# Patient Record
Sex: Male | Born: 1985 | Race: Black or African American | Hispanic: No | Marital: Single | Smoking: Current every day smoker
Health system: Southern US, Community
[De-identification: ages and names within clinical notes are randomized; demographics above are authoritative.]

## PROBLEM LIST (undated history)

## (undated) DIAGNOSIS — J45909 Unspecified asthma, uncomplicated: Secondary | ICD-10-CM

---

## 2004-07-16 ENCOUNTER — Emergency Department (HOSPITAL_COMMUNITY): Admission: EM | Admit: 2004-07-16 | Discharge: 2004-07-17 | Payer: Self-pay | Admitting: Emergency Medicine

## 2004-07-17 ENCOUNTER — Emergency Department (HOSPITAL_COMMUNITY): Admission: EM | Admit: 2004-07-17 | Discharge: 2004-07-17 | Payer: Self-pay | Admitting: *Deleted

## 2005-01-05 ENCOUNTER — Emergency Department (HOSPITAL_COMMUNITY): Admission: EM | Admit: 2005-01-05 | Discharge: 2005-01-06 | Payer: Self-pay | Admitting: Emergency Medicine

## 2005-03-13 ENCOUNTER — Emergency Department (HOSPITAL_COMMUNITY): Admission: EM | Admit: 2005-03-13 | Discharge: 2005-03-13 | Payer: Self-pay | Admitting: Emergency Medicine

## 2011-01-21 ENCOUNTER — Emergency Department (HOSPITAL_COMMUNITY)
Admission: EM | Admit: 2011-01-21 | Discharge: 2011-01-21 | Disposition: A | Discharge status: Home / Self Care | Payer: Self-pay | Attending: Emergency Medicine | Admitting: Emergency Medicine

## 2011-01-21 DIAGNOSIS — R5381 Other malaise: Secondary | ICD-10-CM | POA: Insufficient documentation

## 2011-01-21 DIAGNOSIS — R0602 Shortness of breath: Secondary | ICD-10-CM | POA: Insufficient documentation

## 2011-01-21 DIAGNOSIS — H9209 Otalgia, unspecified ear: Secondary | ICD-10-CM | POA: Insufficient documentation

## 2011-01-21 DIAGNOSIS — R5383 Other fatigue: Secondary | ICD-10-CM | POA: Insufficient documentation

## 2011-01-21 DIAGNOSIS — R21 Rash and other nonspecific skin eruption: Secondary | ICD-10-CM | POA: Insufficient documentation

## 2011-01-21 DIAGNOSIS — J45909 Unspecified asthma, uncomplicated: Secondary | ICD-10-CM | POA: Insufficient documentation

## 2013-11-12 ENCOUNTER — Emergency Department (HOSPITAL_COMMUNITY)
Admission: EM | Admit: 2013-11-12 | Discharge: 2013-11-13 | Disposition: A | Discharge status: Home / Self Care | Payer: Self-pay | Attending: Emergency Medicine | Admitting: Emergency Medicine

## 2013-11-12 ENCOUNTER — Encounter (HOSPITAL_COMMUNITY): Payer: Self-pay | Admitting: Emergency Medicine

## 2013-11-12 DIAGNOSIS — K0889 Other specified disorders of teeth and supporting structures: Secondary | ICD-10-CM

## 2013-11-12 DIAGNOSIS — Z792 Long term (current) use of antibiotics: Secondary | ICD-10-CM | POA: Insufficient documentation

## 2013-11-12 DIAGNOSIS — K2970 Gastritis, unspecified, without bleeding: Secondary | ICD-10-CM | POA: Insufficient documentation

## 2013-11-12 DIAGNOSIS — Z880 Allergy status to penicillin: Secondary | ICD-10-CM | POA: Insufficient documentation

## 2013-11-12 DIAGNOSIS — Z79899 Other long term (current) drug therapy: Secondary | ICD-10-CM | POA: Insufficient documentation

## 2013-11-12 DIAGNOSIS — K2990 Gastroduodenitis, unspecified, without bleeding: Secondary | ICD-10-CM

## 2013-11-12 DIAGNOSIS — F17200 Nicotine dependence, unspecified, uncomplicated: Secondary | ICD-10-CM | POA: Insufficient documentation

## 2013-11-12 DIAGNOSIS — K029 Dental caries, unspecified: Secondary | ICD-10-CM | POA: Insufficient documentation

## 2013-11-12 DIAGNOSIS — K089 Disorder of teeth and supporting structures, unspecified: Secondary | ICD-10-CM | POA: Insufficient documentation

## 2013-11-12 MED ORDER — HYDROCODONE-ACETAMINOPHEN 5-325 MG PO TABS: 1.0000 | ORAL_TABLET | Freq: Four times a day (QID) | ORAL | Status: DC | PRN

## 2013-11-12 MED ORDER — CLINDAMYCIN HCL 300 MG PO CAPS
300.0000 mg | ORAL_CAPSULE | Freq: Once | ORAL | Status: AC
  Administered 2013-11-13: 300 mg via ORAL
  Filled 2013-11-12: qty 1

## 2013-11-12 MED ORDER — OMEPRAZOLE 20 MG PO CPDR: 20.0000 mg | DELAYED_RELEASE_CAPSULE | Freq: Every day | ORAL | Status: DC

## 2013-11-12 MED ORDER — SUCRALFATE 1 GM/10ML PO SUSP: 1.0000 g | Freq: Three times a day (TID) | ORAL | Status: AC

## 2013-11-12 MED ORDER — CLINDAMYCIN HCL 150 MG PO CAPS: 300.0000 mg | ORAL_CAPSULE | Freq: Three times a day (TID) | ORAL | Status: DC

## 2013-11-12 MED ORDER — GI COCKTAIL ~~LOC~~
30.0000 mL | Freq: Once | ORAL | Status: AC
  Administered 2013-11-12: 30 mL via ORAL
  Filled 2013-11-12 (×2): qty 30

## 2013-11-12 NOTE — ED Notes (Signed)
The pt has had a toothache a headache with abd pain  And chest since he started taking advil for his toothache.  He has had a ll these symptoms for one week

## 2013-11-12 NOTE — ED Notes (Signed)
Pt states that he started having a tooth ache 3 days ago, states he took 8 tables of ibuprofen over 2 days, and started having abdominal pain today.

## 2013-11-12 NOTE — ED Provider Notes (Signed)
CSN: [id number]     Arrival date & time 11/12/13  2232 History   First MD Initiated Contact with Patient 11/12/13 2309     Chief Complaint  Patient presents with  . multiple complaints     (Consider location/radiation/quality/duration/timing/severity/associated sxs/prior Treatment) HPI Comments: Patient is a 28 year old male with no significant past medical history who presents to the emergency department for dentalgia x5 days. Patient states that pain has been throbbing and constant as well as worsening since onset. Pain located to right upper first molar. Patient has been taking NSAIDs for symptoms with mild, temporary relief. He states that 3 days after NSAID use he developed discomfort in the epigastric region of his abdomen. He states that this pain is sharp and has improved since discontinuing NSAID use. Patient denies associated fever, inability to swallow, drooling, difficulty opening his jaw, facial swelling, neck stiffness, nausea, vomiting, diarrhea, melena or hematochezia, numbness, and weakness.  The history is provided by the patient. No language interpreter was used.    History reviewed. No pertinent past medical history. History reviewed. No pertinent past surgical history. No family history on file. History  Substance Use Topics  . Smoking status: Current Every Day Smoker  . Smokeless tobacco: Not on file  . Alcohol Use: No    Review of Systems  Constitutional: Negative for fever.  HENT: Positive for dental problem. Negative for drooling, facial swelling, sore throat and trouble swallowing.   Respiratory: Negative for shortness of breath.   Cardiovascular: Negative for chest pain.  Gastrointestinal: Positive for abdominal pain. Negative for vomiting, diarrhea and blood in stool.  All other systems reviewed and are negative.    Allergies  Penicillins  Home Medications   Prior to Admission medications   Medication Sig Start Date End Date Taking? Authorizing  Provider  clindamycin (CLEOCIN) 150 MG capsule Take 2 capsules (300 mg total) by mouth 3 (three) times daily. 11/12/13   Antony MaduraKelly Petrice Beedy, PA-C  HYDROcodone-acetaminophen (NORCO/VICODIN) 5-325 MG per tablet Take 1 tablet by mouth every 6 (six) hours as needed for moderate pain or severe pain. 11/12/13   Antony MaduraKelly Oluwatoni Rotunno, PA-C  omeprazole (PRILOSEC) 20 MG capsule Take 1 capsule (20 mg total) by mouth daily. 11/12/13   Antony MaduraKelly Kairee Kozma, PA-C  sucralfate (CARAFATE) 1 GM/10ML suspension Take 10 mLs (1 g total) by mouth 4 (four) times daily -  with meals and at bedtime. 11/12/13   Antony MaduraKelly Jazzalyn Loewenstein, PA-C   BP 126/73  Pulse 83  Temp(Src) 98.5 F (36.9 C) (Oral)  Resp 14  SpO2 95%  Physical Exam  Nursing note and vitals reviewed. Constitutional: He is oriented to person, place, and time. He appears well-developed and well-nourished. No distress.  Nontoxic/nonseptic appearing.  HENT:  Head: Normocephalic and atraumatic.  Right Ear: Hearing and external ear normal.  Left Ear: Hearing and external ear normal.  Mouth/Throat: Uvula is midline, oropharynx is clear and moist and mucous membranes are normal. No trismus in the jaw. Abnormal dentition. Dental caries present. No dental abscesses or uvula swelling.    TTP of R upper 1st molar without fluctuance. Uvula midline. No trismus, evidence of oral trauma, or oral bleeding/lesions.  Eyes: Conjunctivae and EOM are normal. No scleral icterus.  Neck: Normal range of motion. Neck supple.  No nuchal rigidity or meningismus.  Cardiovascular: Normal rate, regular rhythm and intact distal pulses.   Pulmonary/Chest: Effort normal. No respiratory distress. He has no wheezes.  Abdominal: Soft. He exhibits no distension. There is no tenderness. There is no  rebound and no guarding.  Soft and nontender abdomen. No masses or peritoneal signs.  Musculoskeletal: Normal range of motion.  Neurological: He is alert and oriented to person, place, and time.  GCS 15. Patient moves extremities  without ataxia.  Skin: Skin is warm and dry. No rash noted. He is not diaphoretic. No erythema. No pallor.  Psychiatric: He has a normal mood and affect. His behavior is normal.    ED Course  Procedures (including critical care time) Labs Review Labs Reviewed - No data to display  Imaging Review No results found.   EKG Interpretation None      MDM   Final diagnoses:  Dentalgia  Gastritis    Patient with toothache x 5 days. No gross abscess. Exam unconcerning for Ludwig's angina or spread of infection. Will treat with clindamycin and pain medicine. Urged patient to follow-up with dentist. Referral and resource guide provided. Patient also with 3 days of epigastric discomfort. Onset after taking ibuprofen and BC for dentalgia. Pain improved x 24 hours after discontinuing NSAIDs. Exam unremarkable, without TTP or peritonitis. Suspect pain secondary to gastritis from NSAID use. Will tx with Prilosec and Carafate. Return precautions provided and discussed. Patient hemodynamically stable and appropriate for d/c with no unaddressed concerns.   Filed Vitals:   11/12/13 2235  BP: 126/73  Pulse: 83  Temp: 98.5 F (36.9 C)  TempSrc: Oral  Resp: 14  SpO2: 95%       Antony MaduraKelly Tobiah Celestine, PA-C 11/13/13 0000

## 2013-11-12 NOTE — Discharge Instructions (Signed)
Take Clindamycin to prevent dental infection. Take Norco as needed for pain control. Take Prilosec to prevent/improve gastritis. Use Carafate for persistent abdominal discomfort.  Dental Pain A tooth ache may be caused by cavities (tooth decay). Cavities expose the nerve of the tooth to air and hot or cold temperatures. It may come from an infection or abscess (also called a boil or furuncle) around your tooth. It is also often caused by dental caries (tooth decay). This causes the pain you are having. DIAGNOSIS  Your caregiver can diagnose this problem by exam. TREATMENT   If caused by an infection, it may be treated with medications which kill germs (antibiotics) and pain medications as prescribed by your caregiver. Take medications as directed.  Only take over-the-counter or prescription medicines for pain, discomfort, or fever as directed by your caregiver.  Whether the tooth ache today is caused by infection or dental disease, you should see your dentist as soon as possible for further care. SEEK MEDICAL CARE IF: The exam and treatment you received today has been provided on an emergency basis only. This is not a substitute for complete medical or dental care. If your problem worsens or new problems (symptoms) appear, and you are unable to meet with your dentist, call or return to this location. SEEK IMMEDIATE MEDICAL CARE IF:   You have a fever.  You develop redness and swelling of your face, jaw, or neck.  You are unable to open your mouth.  You have severe pain uncontrolled by pain medicine. MAKE SURE YOU:   Understand these instructions.  Will watch your condition.  Will get help right away if you are not doing well or get worse. Document Released: 04/29/2005 Document Revised: 07/22/2011 Document Reviewed: 12/16/2007 [facility] Patient Information 2015 [place], [place]. This information is not intended to replace advice given to you by your health care provider. Make sure you  discuss any questions you have with your health care provider. Gastritis, Adult Gastritis is soreness and puffiness (inflammation) of the lining of the stomach. If you do not get help, gastritis can cause bleeding and sores (ulcers) in the stomach. HOME CARE   Only take medicine as told by your doctor.  If you were given antibiotic medicines, take them as told. Finish the medicines even if you start to feel better.  Drink enough fluids to keep your pee (urine) clear or pale yellow.  Avoid foods and drinks that make your problems worse. Foods you may want to avoid include:  Caffeine or alcohol.  Chocolate.  Mint.  Garlic and onions.  Spicy foods.  Citrus fruits, including oranges, lemons, or limes.  Food containing tomatoes, including sauce, chili, salsa, and pizza.  Fried and fatty foods.  Eat small meals throughout the day instead of large meals. GET HELP RIGHT AWAY IF:   You have black or dark red poop (stools).  You throw up (vomit) blood. It may look like coffee grounds.  You cannot keep fluids down.  Your belly (abdominal) pain gets worse.  You have a fever.  You do not feel better after 1 week.  You have any other questions or concerns. MAKE SURE YOU:   Understand these instructions.  Will watch your condition.  Will get help right away if you are not doing well or get worse. Document Released: 10/16/2007 Document Revised: 07/22/2011 Document Reviewed: 06/12/2011 [facility] Patient Information 2015 [place], [place]. This information is not intended to replace advice given to you by your health care provider. Make sure you  discuss any questions you have with your health care provider.  Emergency Department Resource Guide 1) Find a Doctor and Pay Out of Pocket Although you won't have to find out who is covered by your insurance plan, it is a good idea to ask around and get recommendations. You will then need to call the office and see if the doctor you have  chosen will accept you as a new patient and what types of options they offer for patients who are self-pay. Some doctors offer discounts or will set up payment plans for their patients who do not have insurance, but you will need to ask so you aren't surprised when you get to your appointment.  2) Contact Your Local Health Department Not all health departments have doctors that can see patients for sick visits, but many do, so it is worth a call to see if yours does. If you don't know where your local health department is, you can check in your phone book. The CDC also has a tool to help you locate your state's health department, and many state websites also have listings of all of their local health departments.  3) Find a Walk-in Clinic If your illness is not likely to be very severe or complicated, you may want to try a walk in clinic. These are popping up all over the country in pharmacies, drugstores, and shopping centers. They're usually staffed by nurse practitioners or physician assistants that have been trained to treat common illnesses and complaints. They're usually fairly quick and inexpensive. However, if you have serious medical issues or chronic medical problems, these are probably not your best option.  No Primary Care Doctor: - Call Health Connect at  [phone] - they can help you locate a primary care doctor that  accepts your insurance, provides certain services, etc. - Physician Referral Service- [phone]  Chronic Pain Problems: Organization         Address  Phone   Notes  [facility]  [phone] Patients need to be referred by their primary care doctor.   Medication Assistance: Organization         Address  Phone   Notes  [facility] Medication [facility] [address] [place] [place], [place] [address] [phone] --Must be a resident of [place] -- Must have NO insurance coverage whatsoever (no Medicaid/ Medicare,  etc.) -- The pt. MUST have a primary care doctor that directs their care regularly and follows them in the community   Med[facility]ist  [phone]   [employer]  [phone]    Agencies that provide inexpensive medical care: Organization         Address  Phone   Notes  [facility]  [phone]   [facility]    [phone]   [facility] [address] [place], [place] [address] [phone]   [facility] [address] [place] [place] [phone]   Planned Parenthood    [phone]   [facility]    [phone]   [facility]  [address], [place] Phone:  [phone], Fax:  [phone] Hours of Operation:  9 am - 6 pm, M-F.  Also accepts Medicaid/Medicare and self-pay.  [facility]  [address], [place] Phone: [phone], Fax: [phone]. Hours of Operation:  8:30 am - 5:30 pm,  M-F.  Also accepts Medicaid and self-pay.  [facility] [place] [address], [place] Phone: [phone](336) [phone]   [facility] [address], [place], [place] [phone], Ext. [phone] Mondays & Thursdays: 7-9 AM.  First 15 patients are seen on a first come, first serve basis.    Medicaid-accepting [facility] Providers:  Organization         Address  Phone   Notes  [facility] [address], [place] [phone]) [phone] Also accepts self-pay patients.  [facility] [address], [place]  [phone]) [phone]   [facility] [address], [address], [place] [phone]) [phone]   [facility] [address] [place] [address], [place] [phone]) [phone]   Renaye RakersVeita Bland [address], [place]   [phone] Only accepts [place] Access [place]icaid patients after they have their name applied to their card.   Self-Pay (no  insurance) in [facility]:  Organization         Address  Phone   Notes  Sickle Cell Patients, [facility] [address], [place] [phone](336) [phone]   [facility] [address], [place] [phone]) [phone]   [facility] [place]  [address], [place] [phone](336) [phone]   [facility]/Dr. Osei-Bonsu  [address] [place] [address], [place] or [phone], [place] [phone]) [phone] Phone number for both [place] and [place] locations is the same.  [facility] [address], [place] [phone]) [phone]   [facility] [address] [place] [address], [place] or [address] [phone]) [phone]) [phone]   [facility] [address], [place] [phone](336) [phone], phone; [phone]) [phone], fax Sees patients 1st and 3rd Saturday of every month.  Must not qualify for public or private insurance (i.e. Medicaid, Medicare, [place] Health Choice, Veterans' Benefits)  Household income should be no more than 200% of the poverty level The clinic cannot treat you if you are pregnant or think you are pregnant  Sexually transmitted diseases are not treated at the clinic.    Dental Care: Organization         Address  Phone  Notes  [facility] [address], [place] [phone] Accepts children up to age 29 who are enrolled in [place]icaid or [place] Health Choice; pregnant women with a Medicaid card; and children who have applied for Medicaid or [place] Health Choice, but were declined, whose parents can pay a reduced fee at time of service.  [facility] of [facility] [place]  [address], [place] [phone]) [phone] Accepts children up to age 29 who are enrolled in [place]icaid or [place] Health Choice; pregnant women with a Medicaid card; and children who have applied for Medicaid or [place] Health Choice, but were  declined, whose parents can pay a reduced fee at time of service.  [facility]  [address], [place] [phone]) [phone] Patients are seen by appointment only. Walk-ins are not accepted. [facility] will see patients 29 years of age and older. Monday - Tuesday (8am-5pm) Most Wednesdays (8:30-5pm) $30 per visit, cash only  [facility]  [address], [facility] [phone]) [phone] Patients are seen by appointment only. Walk-ins are not accepted.  [facility] will see patients 29 years of age and older. One Wednesday Evening (Monthly: Volunteer Based).  $30 per visit, cash only  [employer] [facility] [facility]  [phone] for adults; Children under age 244, call Graduate Pediatric Dentistry at [phone]. Children aged 84-14, please call [phone] to request a pediatric application.  Dental services are provided in all areas of dental care including fillings, crowns and bridges, complete and partial dentures, implants, gum treatment, root canals, and extractions. Preventive care is also provided. Treatment is provided to both adults and children. Patients are selected via a lottery and there is often a waiting list.   [facility] [address], [place]  [phone]) [phone] [email or web address]   [facility] [address], [place], [place] [phone], Ext. [phone] Second and Fourth Thursday of each month, opens at 6:30 AM; Clinic ends at 9 AM.  Patients are seen on a first-come first-served basis, and a limited number are seen during each clinic.   [facility]  [address], [place], [place] [phone]) [phone]   Eligibility Requirements You must have lived in [place], [place], or [place] counties for at least the last three months.   You cannot be eligible for state or federal sponsored National Cityhealthcare insurance, including CIGNAVeterans Administration, [place]icaid, or [employer]Medicare.   You generally cannot be  eligible for healthcare insurance through your employer.    How to apply: Eligibility screenings are held every Tuesday and Wednesday afternoon from 1:00 pm until 4:00 pm. You do not need an appointment for the interview!  [facility] [address], [place], [place] [phone]   [facility]  [phone]   [facility]  [phone]   [facility]  [phone]    Behavioral Health Resources in the Community: Intensive Outpatient Programs Organization         Address  Phone  Notes  [facility] [address], [place], [place] [phone]   [facility] [address], [place], [place] [phone]   [facility] [address], [place], [place]  [phone]   [facility] [address],  [place], [place] [phone] or [phone]   Substance Abuse Resources Organization         Address  Phone  Notes  [facility]  [phone]   [facility]  [phone]   [facility]  [phone]   [facility]  [phone]   Residential & Outpatient Substance Abuse Program  [phone]   Psychological Services Organization         Address  Phone  Notes  [facility]  [phone]- [phone]   [facility]  [phone]- [phone]   [facility] [address], [place] [phone] or [phone]    Mobile Crisis Teams Organization         Address  Phone  Notes  [facility], Mobile Crisis Care Unit  [phone]   [facility]  [address] [place], [place] [phone]   Doristine LocksSharon DeEsch [address] [place] [phone]    Self-Help/Support Groups Organization         Address  Phone             Notes  Mental Health Assoc. of  - variety of support groups  336- [phone] Call for more information    Narcotics Anonymous (NA), Caring  Services [address], [place]  2 meetings at this location   Residential Sports administratorTreatment Programs Organization         Address  Phone  Notes  [facility] [address],    [place]  [phone]   [facility]  [address], [place] [phone], [place], [place] [phone]   [facility] [address], [place] [phone] Admissions: 8am-3pm M-F  [facility] [address]    [place], [place] [phone]   [facility] [address], [place], [place] [phone]   [facility] [address]  [place], [place] [phone]   [facility] - Intensive Outpatient [address], [place], [place] [phone]   [facility] ([facility] [address]  [place], [place] [phone] or [phone]   [facility] (RT[facility]) [address] [place], [place] [phone] Accepts Medicaid  [facility] [address]  [place] [phone] Substance Abuse/Addiction Treatment   [facility]al Health Resources Organization         Address  Phone  Notes  [facility]  [phone]   Angie FavaJulie Brannon, PhD [address], [address] [place], [place]   [phone](336) [phone] or [phone](336) [phone]   [facility]   [address] [place], [place] [phone]   [facility] [address], [place], [place] [phone](336) [phone] Insurance/Medicaid/sponsorship through [facility]  Faith and Families [address]                                    [place], [place] [phone](336) [phone] Therapy/tele-psych/case  [facility] [address].   [place], [place] [phone]    Dr. Lolly MustacheArfeen  [phone]) [phone]   [facility] [place]  [facility] 1) [address], [place] 2) [address], [place] 3)  [address], [place] [phone]) [phone](336)  [phone]  [phone](336) [phone]   [facility] Child Abuse Hotline [phone](336) [phone] or [phone](336) [phone] (After Hours)

## 2013-11-13 NOTE — ED Provider Notes (Signed)
Medical screening examination/treatment/procedure(s) were performed by non-physician practitioner and as supervising physician I was immediately available for consultation/collaboration.   EKG Interpretation None        Lyanne CoKevin M Aniyha Tate, MD 11/13/13 0010

## 2014-08-05 ENCOUNTER — Emergency Department (HOSPITAL_COMMUNITY): Payer: No Typology Code available for payment source

## 2014-08-05 ENCOUNTER — Emergency Department (HOSPITAL_COMMUNITY)
Admission: EM | Admit: 2014-08-05 | Discharge: 2014-08-06 | Disposition: A | Discharge status: Home / Self Care | Payer: No Typology Code available for payment source | Attending: Emergency Medicine | Admitting: Emergency Medicine

## 2014-08-05 ENCOUNTER — Encounter (HOSPITAL_COMMUNITY): Payer: Self-pay | Admitting: Vascular Surgery

## 2014-08-05 DIAGNOSIS — S3991XA Unspecified injury of abdomen, initial encounter: Secondary | ICD-10-CM | POA: Diagnosis not present

## 2014-08-05 DIAGNOSIS — Z880 Allergy status to penicillin: Secondary | ICD-10-CM | POA: Insufficient documentation

## 2014-08-05 DIAGNOSIS — S80212A Abrasion, left knee, initial encounter: Secondary | ICD-10-CM | POA: Insufficient documentation

## 2014-08-05 DIAGNOSIS — Z23 Encounter for immunization: Secondary | ICD-10-CM | POA: Diagnosis not present

## 2014-08-05 DIAGNOSIS — S20211A Contusion of right front wall of thorax, initial encounter: Secondary | ICD-10-CM

## 2014-08-05 DIAGNOSIS — R55 Syncope and collapse: Secondary | ICD-10-CM | POA: Diagnosis not present

## 2014-08-05 DIAGNOSIS — S80211A Abrasion, right knee, initial encounter: Secondary | ICD-10-CM | POA: Insufficient documentation

## 2014-08-05 DIAGNOSIS — Z720 Tobacco use: Secondary | ICD-10-CM | POA: Diagnosis not present

## 2014-08-05 DIAGNOSIS — Z792 Long term (current) use of antibiotics: Secondary | ICD-10-CM | POA: Insufficient documentation

## 2014-08-05 DIAGNOSIS — T1490XA Injury, unspecified, initial encounter: Secondary | ICD-10-CM

## 2014-08-05 DIAGNOSIS — J45909 Unspecified asthma, uncomplicated: Secondary | ICD-10-CM | POA: Insufficient documentation

## 2014-08-05 DIAGNOSIS — S0990XA Unspecified injury of head, initial encounter: Secondary | ICD-10-CM | POA: Diagnosis not present

## 2014-08-05 DIAGNOSIS — Y999 Unspecified external cause status: Secondary | ICD-10-CM | POA: Insufficient documentation

## 2014-08-05 DIAGNOSIS — T148XXA Other injury of unspecified body region, initial encounter: Secondary | ICD-10-CM

## 2014-08-05 DIAGNOSIS — Y939 Activity, unspecified: Secondary | ICD-10-CM | POA: Diagnosis not present

## 2014-08-05 DIAGNOSIS — Z79899 Other long term (current) drug therapy: Secondary | ICD-10-CM | POA: Diagnosis not present

## 2014-08-05 DIAGNOSIS — S3992XA Unspecified injury of lower back, initial encounter: Secondary | ICD-10-CM | POA: Diagnosis not present

## 2014-08-05 DIAGNOSIS — Y92410 Unspecified street and highway as the place of occurrence of the external cause: Secondary | ICD-10-CM | POA: Insufficient documentation

## 2014-08-05 DIAGNOSIS — S50811A Abrasion of right forearm, initial encounter: Secondary | ICD-10-CM | POA: Insufficient documentation

## 2014-08-05 DIAGNOSIS — S29001A Unspecified injury of muscle and tendon of front wall of thorax, initial encounter: Secondary | ICD-10-CM | POA: Diagnosis present

## 2014-08-05 HISTORY — DX: Unspecified asthma, uncomplicated: J45.909

## 2014-08-05 LAB — CBC WITH DIFFERENTIAL/PLATELET
BASOS ABS: 0.1 10*3/uL (ref 0.0–0.1)
BASOS PCT: 1 % (ref 0–1)
EOS ABS: 0.3 10*3/uL (ref 0.0–0.7)
Eosinophils Relative: 3 % (ref 0–5)
HCT: 46.8 % (ref 39.0–52.0)
Hemoglobin: 15.9 g/dL (ref 13.0–17.0)
LYMPHS ABS: 2.4 10*3/uL (ref 0.7–4.0)
LYMPHS PCT: 26 % (ref 12–46)
MCH: 31 pg (ref 26.0–34.0)
MCHC: 34 g/dL (ref 30.0–36.0)
MCV: 91.2 fL (ref 78.0–100.0)
Monocytes Absolute: 0.5 10*3/uL (ref 0.1–1.0)
Monocytes Relative: 6 % (ref 3–12)
NEUTROS PCT: 64 % (ref 43–77)
Neutro Abs: 6 10*3/uL (ref 1.7–7.7)
PLATELETS: 238 10*3/uL (ref 150–400)
RBC: 5.13 MIL/uL (ref 4.22–5.81)
RDW: 12.8 % (ref 11.5–15.5)
WBC: 9.3 10*3/uL (ref 4.0–10.5)

## 2014-08-05 LAB — I-STAT CHEM 8, ED
BUN: 7 mg/dL (ref 6–23)
CALCIUM ION: 1.28 mmol/L — AB (ref 1.12–1.23)
CHLORIDE: 102 mmol/L (ref 96–112)
Creatinine, Ser: 0.9 mg/dL (ref 0.50–1.35)
GLUCOSE: 101 mg/dL — AB (ref 70–99)
HEMATOCRIT: 50 % (ref 39.0–52.0)
HEMOGLOBIN: 17 g/dL (ref 13.0–17.0)
POTASSIUM: 3.9 mmol/L (ref 3.5–5.1)
Sodium: 142 mmol/L (ref 135–145)
TCO2: 24 mmol/L (ref 0–100)

## 2014-08-05 LAB — HEPATIC FUNCTION PANEL
ALK PHOS: 104 U/L (ref 39–117)
ALT: 50 U/L (ref 0–53)
AST: 29 U/L (ref 0–37)
Albumin: 4.2 g/dL (ref 3.5–5.2)
Bilirubin, Direct: <0.1 mg/dL (ref 0.0–0.5)
TOTAL PROTEIN: 7.3 g/dL (ref 6.0–8.3)
Total Bilirubin: 0.6 mg/dL (ref 0.3–1.2)

## 2014-08-05 MED ORDER — TETANUS-DIPHTH-ACELL PERTUSSIS 5-2.5-18.5 LF-MCG/0.5 IM SUSP
0.5000 mL | Freq: Once | INTRAMUSCULAR | Status: AC
  Administered 2014-08-05: 0.5 mL via INTRAMUSCULAR
  Filled 2014-08-05: qty 0.5

## 2014-08-05 MED ORDER — HYDROCODONE-ACETAMINOPHEN 5-325 MG PO TABS
1.0000 | ORAL_TABLET | Freq: Once | ORAL | Status: AC
  Administered 2014-08-05: 1 via ORAL
  Filled 2014-08-05: qty 1
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
## 2014-08-06 ENCOUNTER — Encounter (HOSPITAL_COMMUNITY): Payer: Self-pay | Admitting: Radiology

## 2014-08-06 ENCOUNTER — Emergency Department (HOSPITAL_COMMUNITY): Payer: No Typology Code available for payment source

## 2014-08-06 MED ORDER — HYDROCODONE-ACETAMINOPHEN 5-325 MG PO TABS: 1.0000 | ORAL_TABLET | Freq: Four times a day (QID) | ORAL | Status: AC | PRN

## 2014-08-06 MED ORDER — IOHEXOL 300 MG/ML  SOLN
100.0000 mL | Freq: Once | INTRAMUSCULAR | Status: AC | PRN
  Administered 2014-08-06: 100 mL via INTRAVENOUS

## 2014-08-06 MED ORDER — IBUPROFEN 600 MG PO TABS: 600.0000 mg | ORAL_TABLET | Freq: Four times a day (QID) | ORAL | Status: DC | PRN
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
## 2015-05-27 ENCOUNTER — Encounter (HOSPITAL_COMMUNITY): Payer: Self-pay | Admitting: Emergency Medicine

## 2015-05-27 DIAGNOSIS — J45909 Unspecified asthma, uncomplicated: Secondary | ICD-10-CM | POA: Insufficient documentation

## 2015-05-27 DIAGNOSIS — R079 Chest pain, unspecified: Secondary | ICD-10-CM | POA: Insufficient documentation

## 2015-05-27 DIAGNOSIS — F17210 Nicotine dependence, cigarettes, uncomplicated: Secondary | ICD-10-CM | POA: Insufficient documentation

## 2015-05-27 LAB — I-STAT TROPONIN, ED: Troponin i, poc: 0 ng/mL (ref 0.00–0.08)

## 2015-05-27 LAB — BASIC METABOLIC PANEL
Anion gap: 8 (ref 5–15)
BUN: 5 mg/dL — ABNORMAL LOW (ref 6–20)
CALCIUM: 9.4 mg/dL (ref 8.9–10.3)
CHLORIDE: 104 mmol/L (ref 101–111)
CO2: 26 mmol/L (ref 22–32)
CREATININE: 0.81 mg/dL (ref 0.61–1.24)
GFR calc Af Amer: >60 mL/min (ref >60)
GFR calc non Af Amer: >60 mL/min (ref >60)
GLUCOSE: 99 mg/dL (ref 65–99)
Potassium: 3.8 mmol/L (ref 3.5–5.1)
Sodium: 138 mmol/L (ref 135–145)

## 2015-05-27 LAB — CBC
HEMATOCRIT: 45.6 % (ref 39.0–52.0)
HEMOGLOBIN: 15.4 g/dL (ref 13.0–17.0)
MCH: 30.9 pg (ref 26.0–34.0)
MCHC: 33.8 g/dL (ref 30.0–36.0)
MCV: 91.4 fL (ref 78.0–100.0)
Platelets: 233 10*3/uL (ref 150–400)
RBC: 4.99 MIL/uL (ref 4.22–5.81)
RDW: 12.9 % (ref 11.5–15.5)
WBC: 8.3 10*3/uL (ref 4.0–10.5)

## 2015-05-27 NOTE — ED Notes (Signed)
Pt c/o central cp x2 months off and on, this time it didn't go away. Cp 4/10. Hx of asthma. VSS.

## 2015-05-27 NOTE — ED Notes (Signed)
Xray tech stated he went to pick up for patient, pt refused xray and walked out of the ER per xray tech.

## 2015-05-28 ENCOUNTER — Emergency Department (HOSPITAL_COMMUNITY)
Admission: EM | Admit: 2015-05-28 | Discharge: 2015-05-28 | Discharge status: Against Medical Advice | Payer: Self-pay | Attending: Emergency Medicine | Admitting: Emergency Medicine

## 2015-05-28 NOTE — ED Notes (Signed)
Pt moved back to waiting room no answer

## 2015-10-19 ENCOUNTER — Emergency Department (HOSPITAL_COMMUNITY)
Admission: EM | Admit: 2015-10-19 | Discharge: 2015-10-19 | Disposition: A | Discharge status: Home / Self Care | Payer: No Typology Code available for payment source

## 2016-04-11 ENCOUNTER — Emergency Department (HOSPITAL_COMMUNITY)
Admission: EM | Admit: 2016-04-11 | Discharge: 2016-04-11 | Disposition: A | Discharge status: Home / Self Care | Payer: Self-pay | Attending: Emergency Medicine | Admitting: Emergency Medicine

## 2016-04-11 ENCOUNTER — Encounter (HOSPITAL_COMMUNITY): Payer: Self-pay | Admitting: Emergency Medicine

## 2016-04-11 DIAGNOSIS — F17210 Nicotine dependence, cigarettes, uncomplicated: Secondary | ICD-10-CM | POA: Insufficient documentation

## 2016-04-11 DIAGNOSIS — K029 Dental caries, unspecified: Secondary | ICD-10-CM | POA: Insufficient documentation

## 2016-04-11 DIAGNOSIS — L738 Other specified follicular disorders: Secondary | ICD-10-CM | POA: Insufficient documentation

## 2016-04-11 DIAGNOSIS — Z79899 Other long term (current) drug therapy: Secondary | ICD-10-CM | POA: Insufficient documentation

## 2016-04-11 DIAGNOSIS — J45909 Unspecified asthma, uncomplicated: Secondary | ICD-10-CM | POA: Insufficient documentation

## 2016-04-11 DIAGNOSIS — L739 Follicular disorder, unspecified: Secondary | ICD-10-CM

## 2016-04-11 MED ORDER — IBUPROFEN 600 MG PO TABS: 600.0000 mg | ORAL_TABLET | Freq: Four times a day (QID) | ORAL | 0 refills | Status: AC | PRN

## 2016-04-11 MED ORDER — CLINDAMYCIN HCL 150 MG PO CAPS: 300.0000 mg | ORAL_CAPSULE | Freq: Three times a day (TID) | ORAL | 0 refills | Status: AC

## 2016-04-11 NOTE — Discharge Instructions (Signed)
Take your antibiotic as prescribed. I also recommend applying warm compresses to the area 3-4 times daily for 15 minutes. Refrain from shaving her Annia FriendlyBeard for the next 2 weeks until your symptoms have completely resolved. Return to the emergency department in 2-3 days for recheck if your symptoms have not improved or have worsened. Please return to the Emergency Department if symptoms worsen or new onset of fever, facial swelling, drainage, swelling.

## 2016-04-11 NOTE — ED Provider Notes (Signed)
[facility]-EMERGENCY DEPT Provider Note   CSN: [id number] Arrival date & time: 04/11/16  1220  By signing my name below, I, [name], attest that this documentation has been prepared under the direction and in the presence of Melburn HakeNicole Tekisha Darcey, [place]PA-C. Electronically Signed: [name], Scribe. 04/11/2016. 1:02 PM.  History   Chief Complaint Chief Complaint  Patient presents with  . Abscess    The history is provided by the patient.  No language interpreter was used.    HPI Comments:  Eric Elliott is a 30 y.o. male who presents to the Emergency Department complaining of right sided jaw swelling which he first noticed this AM. He reports pain when opening his mouth. He also notes there was a bump at the site 2 weeks ago that he popped; states he last shaved 3 weeks ago. No recent facial injury.  He reports associated right ear pain. No alleviating factors noted. He denies fever, dental pain, sore throat, CP, acute SOB, neck pain, and HA.   Past Medical History:  Diagnosis Date  . Asthma     There are no active problems to display for this patient.   History reviewed. No pertinent surgical history.     Home Medications    Prior to Admission medications   Medication Sig Start Date End Date Taking? Authorizing Provider  clindamycin (CLEOCIN) 150 MG capsule Take 2 capsules (300 mg total) by mouth 3 (three) times daily. May dispense as 150mg  capsules 04/11/16   Barrett HenleNicole Elizabeth Daylani Deblois, PA-C  HYDROcodone-acetaminophen (NORCO/VICODIN) 5-325 MG per tablet Take 1 tablet by mouth every 6 (six) hours as needed for moderate pain. 08/06/14   Earley FavorGail Schulz, NP  ibuprofen (ADVIL,MOTRIN) 600 MG tablet Take 1 tablet (600 mg total) by mouth every 6 (six) hours as needed. 04/11/16   Barrett HenleNicole Elizabeth Canyon Willow, PA-C  omeprazole (PRILOSEC) 20 MG capsule Take 1 capsule (20 mg total) by mouth daily. 11/12/13   Antony MaduraKelly Humes, PA-C  sucralfate (CARAFATE) 1 GM/10ML suspension Take 10 mLs (1 g total) by mouth 4  (four) times daily -  with meals and at bedtime. 11/12/13   Antony MaduraKelly Humes, PA-C    Family History History reviewed. No pertinent family history.  Social History Social History  Substance Use Topics  . Smoking status: Current Every Day Smoker    Packs/day: 0.50    Types: Cigarettes  . Smokeless tobacco: Never Used  . Alcohol use No     Allergies   Penicillins   Review of Systems Review of Systems  Constitutional: Negative for chills and fever.  HENT: Positive for ear pain and facial swelling. Negative for dental problem and sore throat.   Respiratory: Negative for shortness of breath.   Cardiovascular: Negative for chest pain.  Musculoskeletal: Negative for neck pain.  Skin: Positive for wound.  Neurological: Negative for headaches.     Physical Exam Updated Vital Signs BP 128/89 (BP Location: Right Arm)   Pulse 72   Temp 98.3 F (36.8 C) (Oral)   Resp 18   SpO2 95%   Physical Exam  Constitutional: He is oriented to person, place, and time. He appears well-developed and well-nourished.  HENT:  Head: Normocephalic and atraumatic.  Mouth/Throat: Uvula is midline, oropharynx is clear and moist and mucous membranes are normal. Abnormal dentition. Dental caries present. No dental abscesses. No oropharyngeal exudate, posterior oropharyngeal edema, posterior oropharyngeal erythema or tonsillar abscesses. No tonsillar exudate.  Eyes: Conjunctivae and EOM are normal. Right eye exhibits no discharge. Left eye exhibits no discharge.  No scleral icterus.  Cardiovascular: Normal rate and regular rhythm.   Pulmonary/Chest: Breath sounds normal. No respiratory distress. He has no wheezes.  Neurological: He is alert and oriented to person, place, and time.  Skin:  Scars noted to right lateral maxillary region around beard with irritated hair follicules noted 2x2 cm area of erythema and induration with TTP  No fluctuance or drainage  No pustule or obvious abscess present   Nursing  note and vitals reviewed.    ED Treatments / Results  DIAGNOSTIC STUDIES:  Oxygen Saturation is 95% on RA, adequate by my interpretation.    COORDINATION OF CARE:  12:59 PM Discussed treatment plan with pt at bedside and pt agreed to plan.  Labs (all labs ordered are listed, but only abnormal results are displayed) Labs Reviewed - No data to display  EKG  EKG Interpretation None       Radiology No results found.  Procedures Procedures (including critical care time)  Medications Ordered in ED Medications - No data to display   Initial Impression / Assessment and Plan / ED Course  I have reviewed the triage vital signs and the nursing notes.  Pertinent labs & imaging results that were available during my care of the patient were reviewed by me and considered in my medical decision making (see chart for details).  Clinical Course      Patient's symptoms consistent with folliculitis. Supportive care and return precautions discussed.  Pt sent home with clindamycin due to PCN allergy. The patient appears reasonably screened and/or stabilized for discharge and I doubt any other emergent medical condition requiring further screening, evaluation, or treatment in the ED prior to discharge.   Final Clinical Impressions(s) / ED Diagnoses   Final diagnoses:  Folliculitis    New Prescriptions Discharge Medication List as of 04/11/2016  1:06 PM     I personally performed the services described in this documentation, which was scribed in my presence. The recorded information has been reviewed and is accurate.      Satira Sarkicole Elizabeth JacksonNadeau, [place] JerseyPA-C 04/11/16 1700  Lorre NickAnthony Allen, MD 04/18/16 [phone]

## 2016-04-11 NOTE — ED Triage Notes (Signed)
Pt from home with c/o right jaw swelling x approx 2 weeks.  Pt reports he "picked" at a spot near his right ear and it drained but it came back.  Pt in NAD, A&O.

## 2020-03-26 ENCOUNTER — Emergency Department (HOSPITAL_COMMUNITY)
Admission: EM | Admit: 2020-03-26 | Discharge: 2020-03-26 | Disposition: A | Discharge status: Home / Self Care | Payer: Medicaid Other | Attending: Emergency Medicine | Admitting: Emergency Medicine

## 2020-03-26 ENCOUNTER — Emergency Department (HOSPITAL_COMMUNITY): Payer: Medicaid Other

## 2020-03-26 ENCOUNTER — Encounter (HOSPITAL_COMMUNITY): Payer: Self-pay | Admitting: Emergency Medicine

## 2020-03-26 ENCOUNTER — Other Ambulatory Visit: Payer: Self-pay

## 2020-03-26 DIAGNOSIS — J45909 Unspecified asthma, uncomplicated: Secondary | ICD-10-CM | POA: Insufficient documentation

## 2020-03-26 DIAGNOSIS — R200 Anesthesia of skin: Secondary | ICD-10-CM

## 2020-03-26 DIAGNOSIS — R0789 Other chest pain: Secondary | ICD-10-CM | POA: Insufficient documentation

## 2020-03-26 DIAGNOSIS — M25511 Pain in right shoulder: Secondary | ICD-10-CM

## 2020-03-26 DIAGNOSIS — R202 Paresthesia of skin: Secondary | ICD-10-CM | POA: Insufficient documentation

## 2020-03-26 DIAGNOSIS — F17210 Nicotine dependence, cigarettes, uncomplicated: Secondary | ICD-10-CM | POA: Insufficient documentation

## 2020-03-26 LAB — CBC
HCT: 48.3 % (ref 39.0–52.0)
Hemoglobin: 16.1 g/dL (ref 13.0–17.0)
MCH: 31 pg (ref 26.0–34.0)
MCHC: 33.3 g/dL (ref 30.0–36.0)
MCV: 92.9 fL (ref 80.0–100.0)
Platelets: 272 10*3/uL (ref 150–400)
RBC: 5.2 MIL/uL (ref 4.22–5.81)
RDW: 13.2 % (ref 11.5–15.5)
WBC: 8.9 10*3/uL (ref 4.0–10.5)
nRBC: 0 % (ref 0.0–0.2)

## 2020-03-26 LAB — PROTIME-INR
INR: 1 (ref 0.8–1.2)
Prothrombin Time: 13.1 seconds (ref 11.4–15.2)

## 2020-03-26 LAB — BASIC METABOLIC PANEL
Anion gap: 10 (ref 5–15)
BUN: <5 mg/dL — ABNORMAL LOW (ref 6–20)
CO2: 24 mmol/L (ref 22–32)
Calcium: 9.6 mg/dL (ref 8.9–10.3)
Chloride: 104 mmol/L (ref 98–111)
Creatinine, Ser: 0.99 mg/dL (ref 0.61–1.24)
GFR, Estimated: >60 mL/min (ref >60)
Glucose, Bld: 121 mg/dL — ABNORMAL HIGH (ref 70–99)
Potassium: 3.6 mmol/L (ref 3.5–5.1)
Sodium: 138 mmol/L (ref 135–145)

## 2020-03-26 LAB — TROPONIN I (HIGH SENSITIVITY)
Troponin I (High Sensitivity): <2 ng/L (ref <18)
Troponin I (High Sensitivity): <2 ng/L (ref <18)

## 2020-03-26 MED ORDER — OMEPRAZOLE 20 MG PO CPDR: 20.0000 mg | DELAYED_RELEASE_CAPSULE | Freq: Every day | ORAL | 0 refills | Status: AC

## 2020-03-26 NOTE — ED Provider Notes (Signed)
[facility] EMERGENCY DEPARTMENT Provider Note   CSN: [id number] Arrival date & time: 03/26/20  3748     History Chief Complaint  Patient presents with  . Chest Pain    Eric Elliott is a 34 y.o. male presenting for evaluation of chest pain and right arm numbness/tingling.  Patient states of the past 4 days he has intermittent chest pain.  Describes as a pain and ache at his mid chest.  Occurs for about 2 hours before resolving, often improved with ibuprofen/Aleve.  There is nothing patient can do to precipitate event, but is more likely to happen at night.  He denies associated fevers, chills, cough, shortness of breath nausea, vomiting, domino pain.  He does have a history of heartburn, states it has been worse recently.  He takes Tums as needed for this.  His symptoms could be similar to his heartburn symptoms.  He also reports a history of asthma, however has not felt chest tightness or wheezing, thus has not used his inhaler when he has had his symptoms. Additionally, patient reporting right arm numbness and tingling.  When he woke up today, he had numbness and tingling his right arm.  It has since improved, and was completely resolved except for a soreness in the triceps the right arm.  He denies increased pain with movement of his arm.  No neck pain.  No fevers or chills.  He has had injury to the right shoulder before, but no recent injury.  He has not taken anything for the symptoms.  HPI     Past Medical History:  Diagnosis Date  . Asthma     There are no problems to display for this patient.   History reviewed. No pertinent surgical history.     No family history on file.  Social History   Tobacco Use  . Smoking status: Current Every Day Smoker    Packs/day: 0.50    Types: Cigarettes  . Smokeless tobacco: Never Used  Substance Use Topics  . Alcohol use: No  . Drug use: No    Home Medications Prior to Admission medications   Medication  Sig Start Date End Date Taking? Authorizing Provider  clindamycin (CLEOCIN) 150 MG capsule Take 2 capsules (300 mg total) by mouth 3 (three) times daily. May dispense as 150mg  capsules 04/11/16   04/13/16, PA-C  HYDROcodone-acetaminophen (NORCO/VICODIN) 5-325 MG per tablet Take 1 tablet by mouth every 6 (six) hours as needed for moderate pain. 08/06/14   08/08/14, NP  ibuprofen (ADVIL,MOTRIN) 600 MG tablet Take 1 tablet (600 mg total) by mouth every 6 (six) hours as needed. 04/11/16   04/13/16, PA-C  omeprazole (PRILOSEC) 20 MG capsule Take 1 capsule (20 mg total) by mouth daily. 03/26/20   Alahia Whicker, PA-C  sucralfate (CARAFATE) 1 GM/10ML suspension Take 10 mLs (1 g total) by mouth 4 (four) times daily -  with meals and at bedtime. 11/12/13   01/13/14, PA-C    Allergies    Penicillins  Review of Systems   Review of Systems  Cardiovascular: Positive for chest pain.  Musculoskeletal: Positive for myalgias.  Neurological: Positive for numbness (resolved).  All other systems reviewed and are negative.   Physical Exam Updated Vital Signs BP (!) 134/98 (BP Location: Left Arm)   Pulse 86   Temp 98 F (36.7 C) (Oral)   Resp 18   Ht 5\' 9"  (1.753 m)   Wt 110 kg   SpO2 99%  BMI 35.81 kg/m   Physical Exam Vitals and nursing note reviewed.  Constitutional:      General: He is not in acute distress.    Appearance: He is well-developed.     Comments: Resting comfortably in the bed in no acute distress  HENT:     Head: Normocephalic and atraumatic.  Eyes:     Conjunctiva/sclera: Conjunctivae normal.     Pupils: Pupils are equal, round, and reactive to light.  Cardiovascular:     Rate and Rhythm: Normal rate and regular rhythm.     Pulses: Normal pulses.  Pulmonary:     Effort: Pulmonary effort is normal. No respiratory distress.     Breath sounds: Normal breath sounds. No wheezing.     Comments: Mild TTP of the chest wall along the low  sternal border.  Speaking full sentences.  Her lung sounds in all fields. Chest:     Chest wall: Tenderness present.  Abdominal:     General: There is no distension.     Palpations: Abdomen is soft. There is no mass.     Tenderness: There is no abdominal tenderness. There is no guarding or rebound.  Musculoskeletal:        General: Tenderness present. Normal range of motion.     Cervical back: Normal range of motion and neck supple.     Comments: TTP of the right shoulder musculature/trapezius.  No pain over midline spine.  Radial pulses 2+ bilaterally.  Grip strength equal bilaterally.  Full active range of motion of the wrist, elbow, shoulder without difficulty.  Skin:    General: Skin is warm and dry.     Capillary Refill: Capillary refill takes less than 2 seconds.  Neurological:     Mental Status: He is alert and oriented to person, place, and time.     ED Results / Procedures / Treatments   Labs (all labs ordered are listed, but only abnormal results are displayed) Labs Reviewed  BASIC METABOLIC PANEL - Abnormal; Notable for the following components:      Result Value   Glucose, Bld 121 (*)    BUN <5 (*)    All other components within normal limits  CBC  PROTIME-INR  TROPONIN I (HIGH SENSITIVITY)  TROPONIN I (HIGH SENSITIVITY)    EKG EKG Interpretation  Date/Time:  Sunday March 26 2020 03:15:55 EST Ventricular Rate:  84 PR Interval:  162 QRS Duration: 80 QT Interval:  356 QTC Calculation: 420 R Axis:   90 Text Interpretation: Normal sinus rhythm Rightward axis Borderline ECG When compared with ECG of 1/14/201/17, No significant change was found Confirmed by Dione Booze ([id number]) on 03/26/2020 3:29:17 AM   Radiology DG Chest 2 View  Result Date: 03/26/2020 CLINICAL DATA:  Chest pain EXAM: CHEST - 2 VIEW COMPARISON:  None. FINDINGS: The heart size and mediastinal contours are within normal limits. Both lungs are clear. The visualized skeletal structures are  unremarkable. IMPRESSION: No active cardiopulmonary disease. Electronically Signed   By: Deatra Robinson M.D.   On: 03/26/2020 04:00    Procedures Procedures (including critical care time)  Medications Ordered in ED Medications - No data to display  ED Course  I have reviewed the triage vital signs and the nursing notes.  Pertinent labs & imaging results that were available during my care of the patient were reviewed by me and considered in my medical decision making (see chart for details).    MDM Rules/Calculators/A&P  Patient presenting for evaluation of chest pain and right arm numbness/tingling.  On exam, patient is nontoxic.  Numbness is most completely resolved.  Patient is neurovascularly intact.  Chest pain is intermittent, worse when patient is laying flat.  He does have a history of heartburn, this has been worse recently.  Consider GERD symptoms.  Labs obtained in triage read interpreted by me, overall reassuring.  Troponin negative x2.  Electrolytes stable.  Chest x-ray viewed interpreted by me, no pneumonia, torso effusion, cardiomegaly.  EKG unchanged from previous, nonischemic.  Low suspicion for ACS, PE, dissection, pneumothorax.  Consider inflammatory cause such as costochondritis.  Consider GI cause such as GERD.  Additionally, patient with what sounds like radicular symptoms of the right arm that have improved.  He does have tenderness palpation of the musculature, discussed treatment of anti-inflammatories and muscle creams/patches.  Encourage patient to follow-up with primary care.  Symptomatic medications given.  At this time, patient appears safe for discharge.  Return precautions given.  Patient states he understands and agrees to plan  Final Clinical Impression(s) / ED Diagnoses Final diagnoses:  Numbness and tingling of right arm  Acute pain of right shoulder  Atypical chest pain    Rx / DC Orders ED Discharge Orders         Ordered     omeprazole (PRILOSEC) 20 MG capsule  Daily        03/26/20 0814           Alveria Apley, PA-C 03/26/20 [id number]    Jacalyn Lefevre, MD 03/26/20 1117

## 2020-03-26 NOTE — ED Notes (Signed)
Pt verbalized understanding of discharge paperwork, prescription and follow-up care.  

## 2020-03-26 NOTE — Discharge Instructions (Signed)
Your work-up for chest pain today was overall reassuring.  There does not appear to be any life-threatening cause for your chest pain. This may be related to heartburn.  Take omeprazole daily for the next 2 weeks to decrease symptoms.  You may take Tums or Rolaids as needed for breakthrough pain. Your right arm numbness/tingling is likely due to muscle pain and swelling of your shoulder.  Take Aleve, 2 tablets, twice a day with meals for the next week.  You may also use muscle creams/patches such a Salonpas, icy hot, BenGay, Biofreeze to help with pain. Follow-up with the [place] [facility] wellness clinic listed below to for further evaluation of your symptoms. Return to the emergency room if you develop any new, worsening, concerning symptoms.

## 2020-03-26 NOTE — ED Triage Notes (Signed)
Patient reports intermittent central chest pain radiating to right arm with numbness onset Tuesday this week , no cough or fever , denies emesis or diaphoresis .

## 2020-12-25 ENCOUNTER — Encounter (HOSPITAL_COMMUNITY): Payer: Self-pay | Admitting: *Deleted

## 2020-12-25 ENCOUNTER — Other Ambulatory Visit: Payer: Self-pay

## 2020-12-25 ENCOUNTER — Emergency Department (HOSPITAL_COMMUNITY)
Admission: EM | Admit: 2020-12-25 | Discharge: 2020-12-25 | Disposition: A | Discharge status: Home / Self Care | Payer: Medicaid Other | Attending: Physician Assistant | Admitting: Physician Assistant

## 2020-12-25 DIAGNOSIS — F17210 Nicotine dependence, cigarettes, uncomplicated: Secondary | ICD-10-CM | POA: Insufficient documentation

## 2020-12-25 DIAGNOSIS — J45909 Unspecified asthma, uncomplicated: Secondary | ICD-10-CM | POA: Insufficient documentation

## 2020-12-25 DIAGNOSIS — U071 COVID-19: Secondary | ICD-10-CM | POA: Insufficient documentation

## 2020-12-25 LAB — RESP PANEL BY RT-PCR (FLU A&B, COVID) ARPGX2
Influenza A by PCR: NEGATIVE
Influenza B by PCR: NEGATIVE
SARS Coronavirus 2 by RT PCR: POSITIVE — AB

## 2020-12-25 LAB — GROUP A STREP BY PCR: Group A Strep by PCR: NOT DETECTED

## 2020-12-25 MED ORDER — BENZONATATE 100 MG PO CAPS: 100.0000 mg | ORAL_CAPSULE | Freq: Three times a day (TID) | ORAL | 0 refills | Status: AC

## 2020-12-25 MED ORDER — GUAIFENESIN ER 600 MG PO TB12: 600.0000 mg | ORAL_TABLET | Freq: Two times a day (BID) | ORAL | 0 refills | Status: AC

## 2020-12-25 NOTE — ED Notes (Signed)
Called for triage with no answer in lobby 

## 2020-12-25 NOTE — ED Provider Notes (Signed)
[facility] EMERGENCY DEPARTMENT Provider Note   CSN: [id number] Arrival date & time: 12/25/20  1040     History Chief Complaint  Patient presents with   Sore Throat    Kathy Mandt is a 35 y.o. male.  HPI  35 year old male with a history of asthma presents the emergency department today for evaluation of a sore throat and cough that started about a week ago.  He lives in a halfway house and states that he may have been exposed to COVID.  Past Medical History:  Diagnosis Date   Asthma     There are no problems to display for this patient.   History reviewed. No pertinent surgical history.     History reviewed. No pertinent family history.  Social History   Tobacco Use   Smoking status: Every Day    Packs/day: 0.50    Types: Cigarettes   Smokeless tobacco: Never  Substance Use Topics   Alcohol use: No   Drug use: No    Home Medications Prior to Admission medications   Medication Sig Start Date End Date Taking? Authorizing Provider  benzonatate (TESSALON) 100 MG capsule Take 1 capsule (100 mg total) by mouth every 8 (eight) hours for 5 days. 12/25/20 12/30/20 Yes Sabria Florido S, PA-C  guaiFENesin (MUCINEX) 600 MG 12 hr tablet Take 1 tablet (600 mg total) by mouth 2 (two) times daily for 5 days. 12/25/20 12/30/20 Yes Christell Steinmiller S, PA-C  clindamycin (CLEOCIN) 150 MG capsule Take 2 capsules (300 mg total) by mouth 3 (three) times daily. May dispense as 150mg  capsules 04/11/16   04/13/16, PA-C  HYDROcodone-acetaminophen (NORCO/VICODIN) 5-325 MG per tablet Take 1 tablet by mouth every 6 (six) hours as needed for moderate pain. 08/06/14   08/08/14, NP  ibuprofen (ADVIL,MOTRIN) 600 MG tablet Take 1 tablet (600 mg total) by mouth every 6 (six) hours as needed. 04/11/16   04/13/16, PA-C  omeprazole (PRILOSEC) 20 MG capsule Take 1 capsule (20 mg total) by mouth daily. 03/26/20   Caccavale, Sophia, PA-C  sucralfate  (CARAFATE) 1 GM/10ML suspension Take 10 mLs (1 g total) by mouth 4 (four) times daily -  with meals and at bedtime. 11/12/13   01/13/14, PA-C    Allergies    Penicillins  Review of Systems   Review of Systems  Constitutional:  Negative for fever.  HENT:  Positive for sore throat.   Respiratory:  Positive for cough. Negative for shortness of breath.   Gastrointestinal:  Negative for abdominal pain, constipation, diarrhea, nausea and vomiting.  Genitourinary:  Negative for flank pain.  Musculoskeletal:  Negative for back pain.  Neurological:  Negative for headaches.   Physical Exam Updated Vital Signs BP 128/86 (BP Location: Right Arm)   Pulse (!) 112   Temp 99.4 F (37.4 C)   Resp 14   SpO2 96%   Physical Exam Vitals and nursing note reviewed.  Constitutional:      Appearance: He is well-developed.  HENT:     Head: Normocephalic and atraumatic.     Mouth/Throat:     Mouth: Mucous membranes are moist.     Pharynx: Posterior oropharyngeal erythema present. No pharyngeal swelling or oropharyngeal exudate.  Eyes:     Conjunctiva/sclera: Conjunctivae normal.  Cardiovascular:     Rate and Rhythm: Normal rate and regular rhythm.     Heart sounds: No murmur heard. Pulmonary:     Effort: Pulmonary effort is normal. No respiratory distress.  Breath sounds: Normal breath sounds.  Abdominal:     Palpations: Abdomen is soft.     Tenderness: There is no abdominal tenderness.  Musculoskeletal:     Cervical back: Neck supple.  Skin:    General: Skin is warm and dry.  Neurological:     Mental Status: He is alert.    ED Results / Procedures / Treatments   Labs (all labs ordered are listed, but only abnormal results are displayed) Labs Reviewed  RESP PANEL BY RT-PCR (FLU A&B, COVID) ARPGX2 - Abnormal; Notable for the following components:      Result Value   SARS Coronavirus 2 by RT PCR POSITIVE (*)    All other components within normal limits  GROUP A STREP BY PCR     EKG None  Radiology No results found.  Procedures Procedures   Medications Ordered in ED Medications - No data to display  ED Course  I have reviewed the triage vital signs and the nursing notes.  Pertinent labs & imaging results that were available during my care of the patient were reviewed by me and considered in my medical decision making (see chart for details).    MDM Rules/Calculators/A&P                          Patient presenting for evaluation for Covid.  Reports symptoms ongoing for 7 days.  Patient nontoxic, well-appearing, no distress.  Vital signs are reassuring. Tested for Covid in the ED. Results positive. Strep negative. Advised on quarantine measures. Will give Rx for symptomatic management. Advised on f/u and return precautions. Pt voiced understanding of the plan and reasons to return. All questions answered, pt stable for d/c.    Final Clinical Impression(s) / ED Diagnoses Final diagnoses:  COVID    Rx / DC Orders ED Discharge Orders          Ordered    benzonatate (TESSALON) 100 MG capsule  Every 8 hours        12/25/20 1451    guaiFENesin (MUCINEX) 600 MG 12 hr tablet  2 times daily        12/25/20 1451             Joleah Kosak S, PA-C 12/25/20 1453    Horton, Clabe Seal, DO 12/27/20 2053

## 2020-12-25 NOTE — Discharge Instructions (Addendum)
Take tessalon and mucinx as prescribed   Please follow up with your primary care provider within 5-7 days for re-evaluation of your symptoms. If you do not have a primary care provider, information for a healthcare clinic has been provided for you to make arrangements for follow up care. Please return to the emergency department for any new or worsening symptoms.

## 2020-12-25 NOTE — ED Triage Notes (Signed)
Pt reports sore throat and cough x1 week.

## 2020-12-25 NOTE — ED Provider Notes (Signed)
Emergency Medicine Provider Triage Evaluation Note  Eric Elliott , a 35 y.o. male  was evaluated in triage.  Pt complains of sore throat and cough. Possible covid exposure.  Review of Systems  Positive: Sore throat, cough Negative: fever  Physical Exam  BP 128/86 (BP Location: Right Arm)   Pulse (!) 112   Temp 99.4 F (37.4 C)   Resp 14   SpO2 96%  Gen:   Awake, no distress   Resp:  Normal effort  MSK:   Moves extremities without difficulty  Other:  Posterior oropharyngeal erythema, no unilateral tonsillar swelling, lungs ctab, heart rrr  Medical Decision Making  Medically screening exam initiated at 12:31 PM.  Appropriate orders placed.  Eric Elliott was informed that the remainder of the evaluation will be completed by another provider, this initial triage assessment does not replace that evaluation, and the importance of remaining in the ED until their evaluation is complete.     Eric Meres, PA-C 12/25/20 1232    Eric Logan, DO 12/27/20 2052

## 2021-03-30 IMAGING — DX DG CHEST 2V
2 series · 2 of 2 positions shown · non-contrast
Comparison: None.

CLINICAL DATA: Chest pain

EXAM:
CHEST - 2 VIEW

[chest pa]
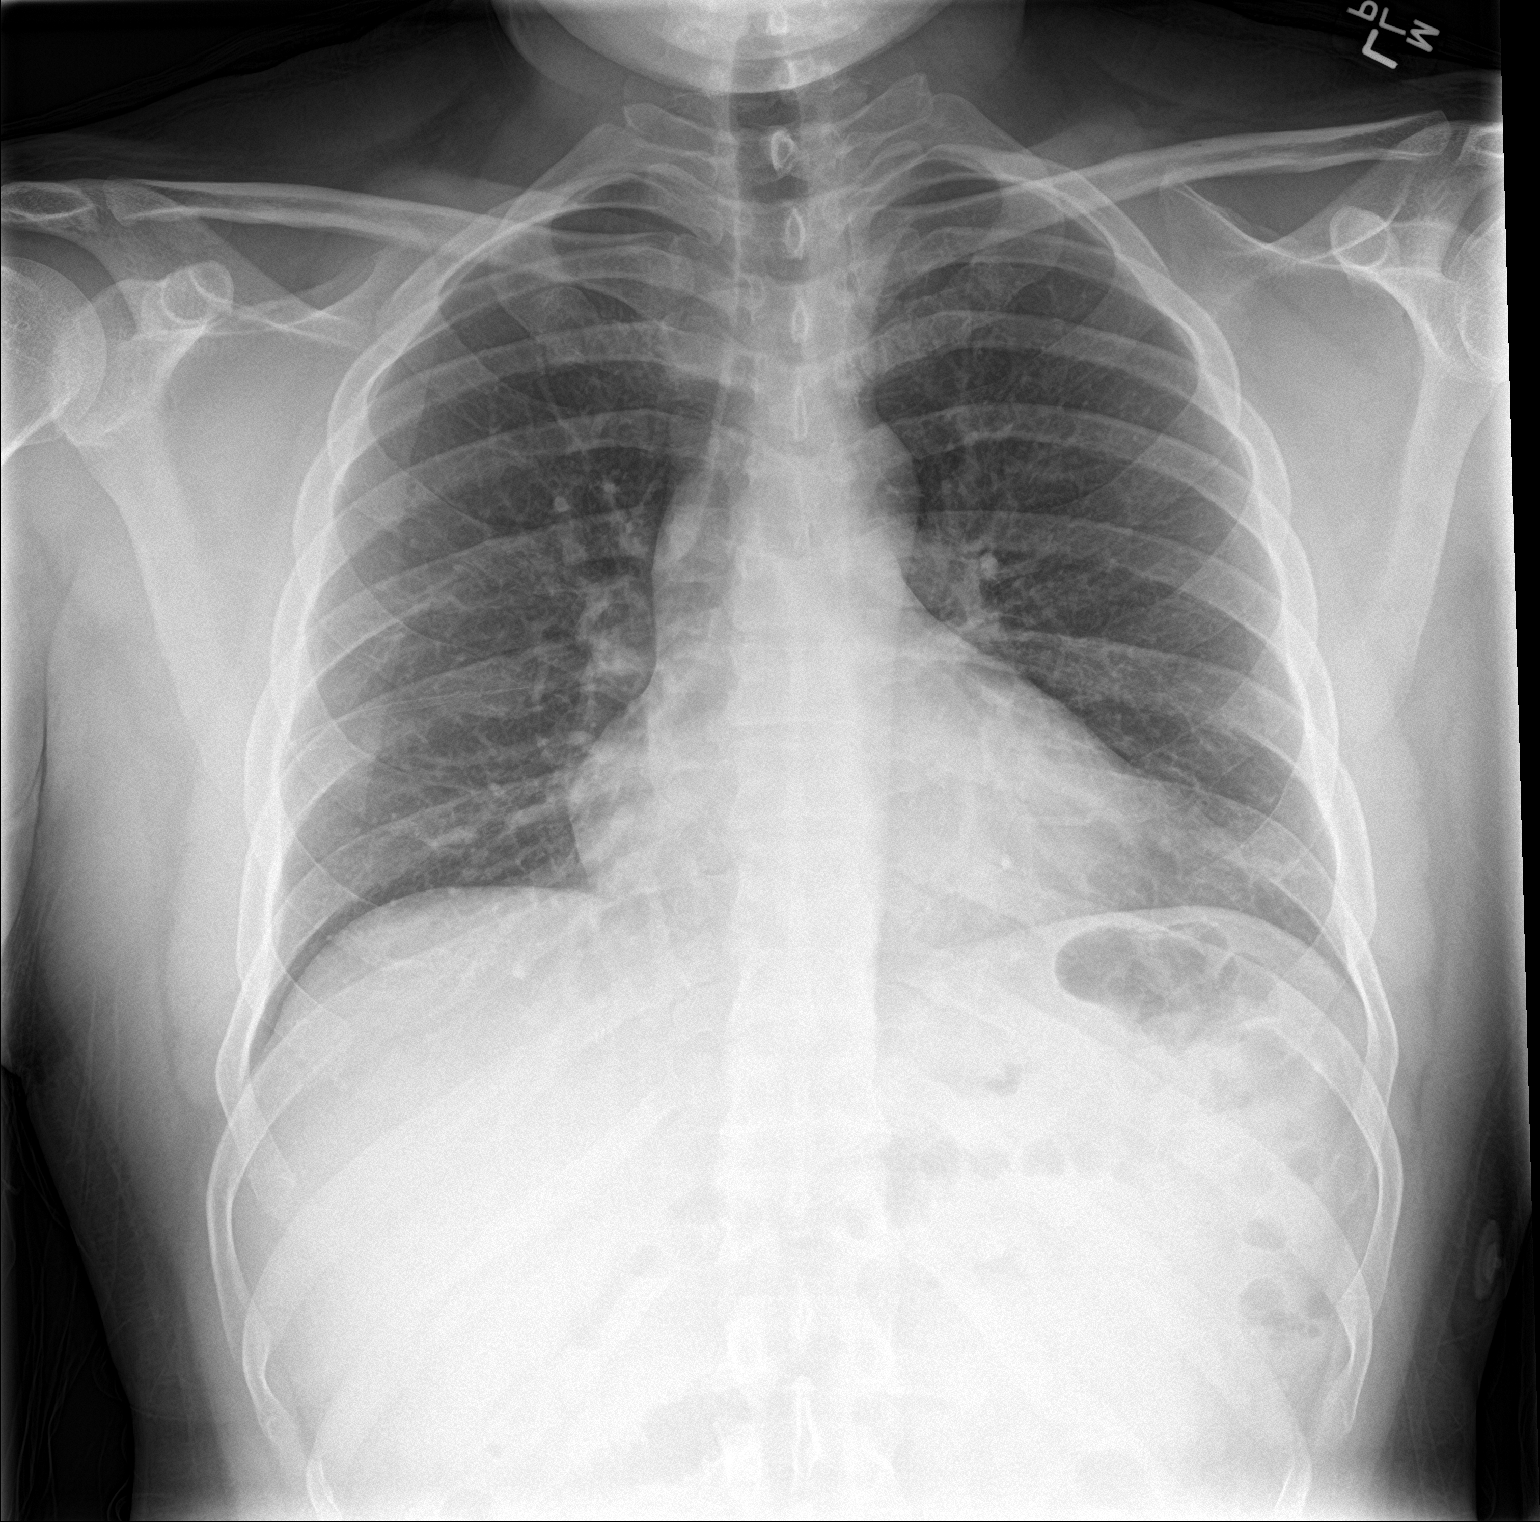

[chest lat]
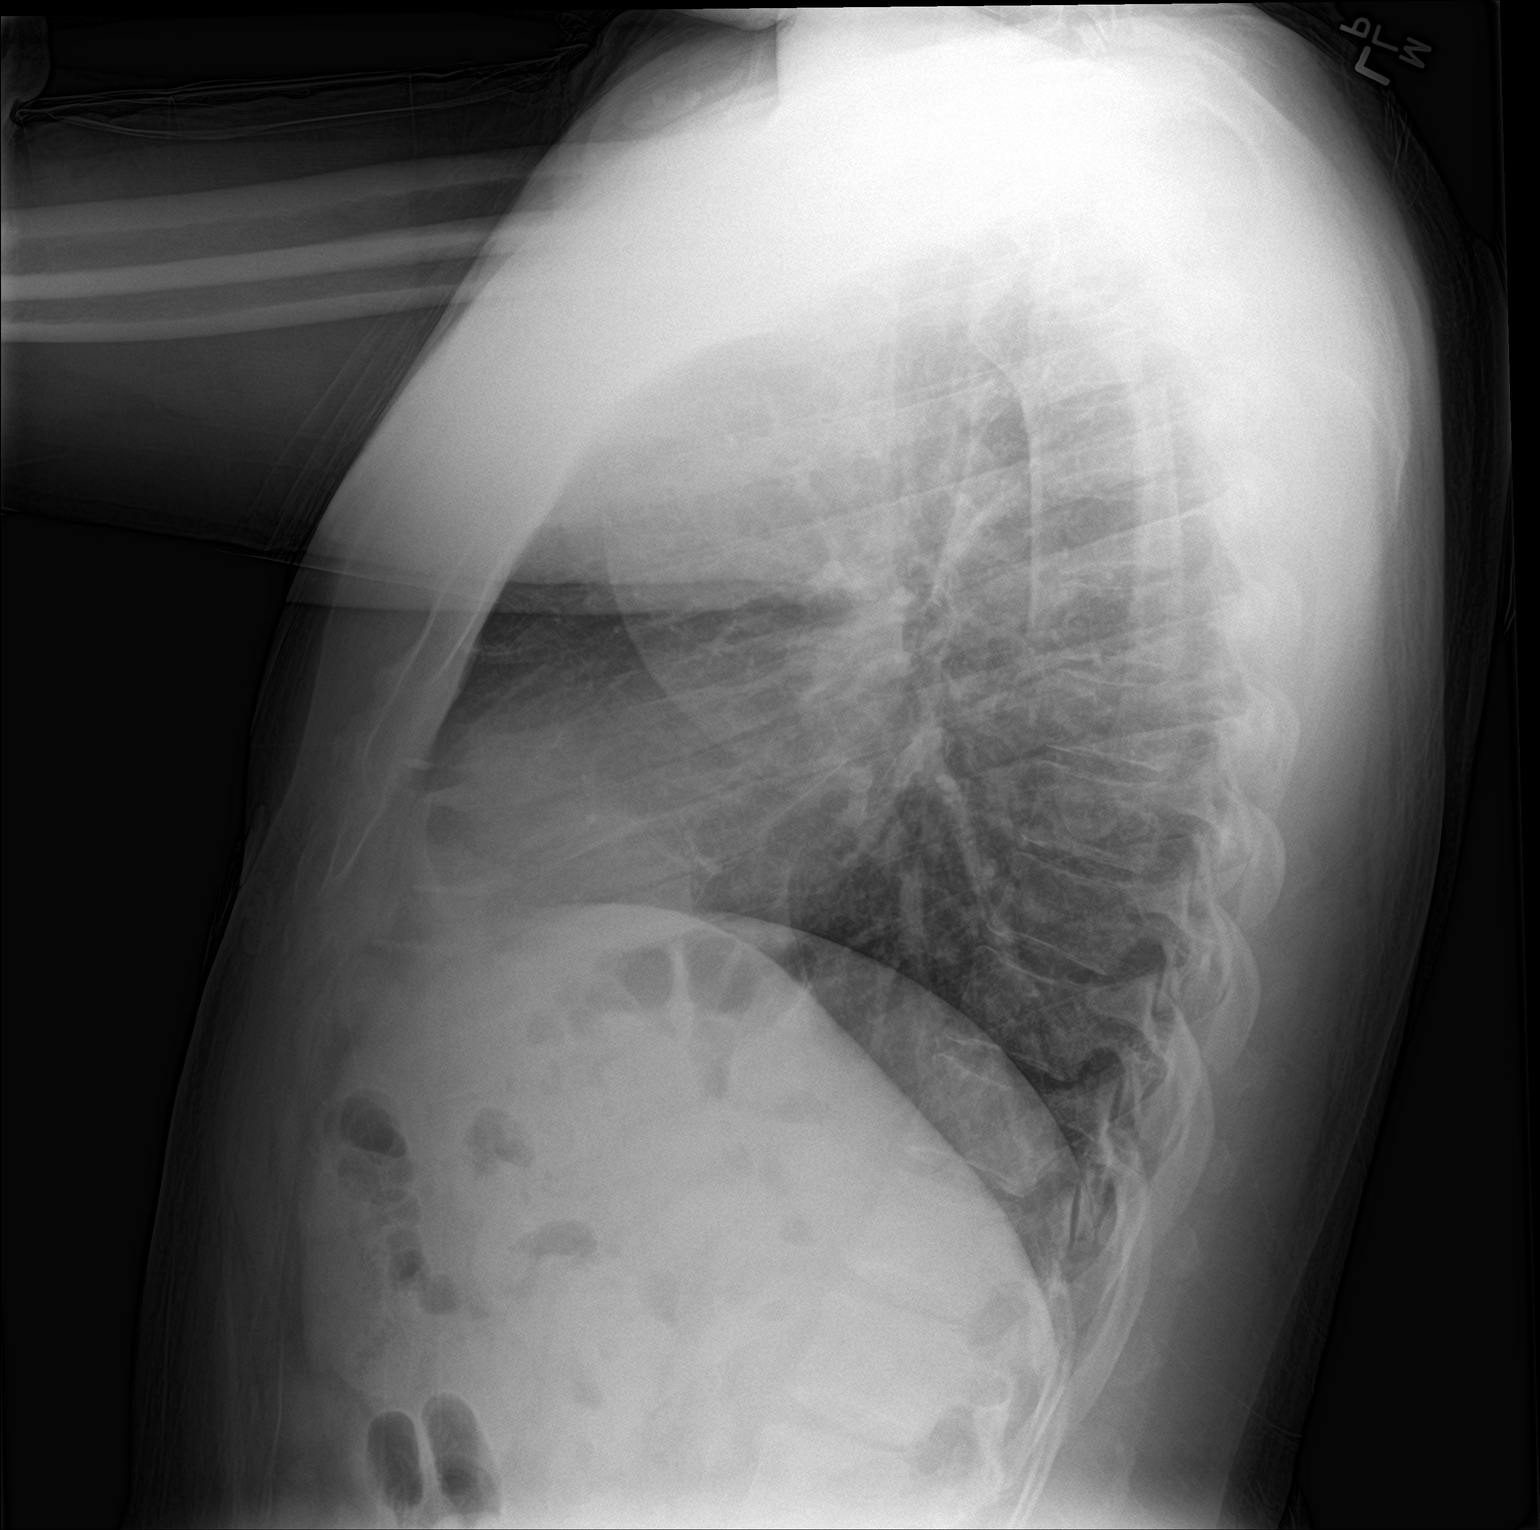

[2 of 2 positions shown; findings below may reference images not displayed]

FINDINGS: The heart size and mediastinal contours are within normal limits.
Both lungs are clear. The visualized skeletal structures are
unremarkable.
IMPRESSION: No active cardiopulmonary disease.
